# Patient Record
Sex: Male | Born: 1995 | Race: Black or African American | Hispanic: No | Marital: Single | Smoking: Never smoker
Health system: Southern US, Community
[De-identification: ages and names within clinical notes are randomized; demographics above are authoritative.]

## PROBLEM LIST (undated history)

## (undated) DIAGNOSIS — F909 Attention-deficit hyperactivity disorder, unspecified type: Secondary | ICD-10-CM

---
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
## 2021-04-29 ENCOUNTER — Emergency Department (HOSPITAL_BASED_OUTPATIENT_CLINIC_OR_DEPARTMENT_OTHER)
Admission: EM | Admit: 2021-04-29 | Discharge: 2021-04-29 | Disposition: A | Discharge status: Home / Self Care | Payer: BC Managed Care – PPO | Attending: Emergency Medicine | Admitting: Emergency Medicine

## 2021-04-29 ENCOUNTER — Other Ambulatory Visit: Payer: Self-pay

## 2021-04-29 ENCOUNTER — Encounter (HOSPITAL_BASED_OUTPATIENT_CLINIC_OR_DEPARTMENT_OTHER): Payer: Self-pay | Admitting: Emergency Medicine

## 2021-04-29 ENCOUNTER — Emergency Department (HOSPITAL_BASED_OUTPATIENT_CLINIC_OR_DEPARTMENT_OTHER): Payer: BC Managed Care – PPO

## 2021-04-29 DIAGNOSIS — S8391XA Sprain of unspecified site of right knee, initial encounter: Secondary | ICD-10-CM | POA: Insufficient documentation

## 2021-04-29 DIAGNOSIS — Y9367 Activity, basketball: Secondary | ICD-10-CM | POA: Insufficient documentation

## 2021-04-29 DIAGNOSIS — Z79899 Other long term (current) drug therapy: Secondary | ICD-10-CM | POA: Diagnosis not present

## 2021-04-29 DIAGNOSIS — S80911A Unspecified superficial injury of right knee, initial encounter: Secondary | ICD-10-CM | POA: Diagnosis present

## 2021-04-29 DIAGNOSIS — W2105XA Struck by basketball, initial encounter: Secondary | ICD-10-CM | POA: Diagnosis not present

## 2021-04-29 NOTE — Discharge Instructions (Addendum)
Wear the knee immobilizer at all times when you are up moving around just to keep your knee stable.  You can take Tylenol and ibuprofen as needed for pain.  Ice and elevate

## 2021-04-29 NOTE — ED Triage Notes (Signed)
Pt reports hyperextended knee yesterday while playing basketball. Pt has tried ice, elevation and ibuprofen without relief. Pt ambulatory with a limp.

## 2021-04-29 NOTE — ED Provider Notes (Signed)
[facility] DEPARTMENT Provider Note   CSN: [id number] Arrival date & time: 04/29/21  [id number]     History  Chief Complaint  Patient presents with   Knee Injury    Luis Andrade is a 26 y.o. male.  Patient is a healthy 26 year old male with no known medical problems who is presenting today with complaint of right knee pain.  On Thursday this week he was playing basketball when he came down and his knee hyperextended and he felt a pop.  It was painful immediately and has continued to be painful but also has been swollen and feels loose when he walks.  He denies any prior significant injury to the leg that did not improve just with time.  He has never had to see a specialist before.  However because the swelling was not improving and the pain was not getting any better he came for evaluation.  The history is provided by the patient.      Home Medications Prior to Admission medications   Medication Sig Start Date End Date Taking? Authorizing Provider  ibuprofen (ADVIL,MOTRIN) 800 MG tablet Take 1 tablet (800 mg total) by mouth 3 (three) times daily. 04/11/14   Evelina Bucy, MD  methylphenidate [facility]) 20 MG/9HR Place 1 patch onto the skin daily. wear patch for 9 hours only each day    [provider]  cetirizine (ZYRTEC) 1 MG/ML syrup Take 5 mLs (5 mg total) by mouth daily. 07/21/14 07/21/14  Glendell Docker, NP      Allergies    Patient has no known allergies.    Review of Systems   Review of Systems  Physical Exam Updated Vital Signs BP (!) 143/81 (BP Location: Right Arm)    Pulse 70    Temp 98.7 F (37.1 C) (Oral)    Resp 18    Ht 5\' 11"  ([id number] m)    Wt 74.8 kg    SpO2 99%    BMI 23.01 kg/m  Physical Exam Vitals and nursing note reviewed.  Constitutional:      General: He is not in acute distress.    Appearance: He is well-developed.  HENT:     Head: Normocephalic and atraumatic.  Eyes:     Conjunctiva/sclera: Conjunctivae normal.      Pupils: Pupils are equal, round, and reactive to light.  Cardiovascular:     Rate and Rhythm: Normal rate.     Heart sounds: No murmur heard. Pulmonary:     Effort: Pulmonary effort is normal.  Musculoskeletal:        General: Tenderness and signs of injury present.     Cervical back: Normal range of motion and neck supple.     Right knee: Effusion present. Decreased range of motion. MCL laxity present.     Comments: Patient has significant joint effusion of the right knee.  Some mild laxity and pain with MCL testing.  No definitive ACL or PCL laxity  Skin:    General: Skin is warm and dry.     Findings: No erythema or rash.  Neurological:     Mental Status: He is alert and oriented to person, place, and time. Mental status is at baseline.  Psychiatric:        Mood and Affect: Mood normal.        Behavior: Behavior normal.    ED Results / Procedures / Treatments   Labs (all labs ordered are listed, but only abnormal results are displayed) Labs Reviewed -  No data to display  EKG None  Radiology DG Knee Complete 4 Views Right  Result Date: 04/29/2021 CLINICAL DATA:  Pain and swelling EXAM: RIGHT KNEE - COMPLETE 4+ VIEW COMPARISON:  07/21/2014 FINDINGS: No fracture or dislocation is seen. There is soft tissue fullness in the suprapatellar bursa. Anterior portion of patella is not included in the lateral view limiting evaluation. IMPRESSION: No recent fracture or dislocation. Small effusion is seen in the suprapatellar bursa. Electronically Signed   By: Elmer Picker M.D.   On: 04/29/2021 09:43    Procedures Procedures    Medications Ordered in ED Medications - No data to display  ED Course/ Medical Decision Making/ A&P                           Medical Decision Making Amount and/or Complexity of Data Reviewed Radiology: ordered and independent interpretation performed. Decision-making details documented in ED Course.   Patient presenting today after an injury on  Thursday with persistent knee pain and swelling.  Based on the patient's exam and history concern for ligamentous tear especially given the extent of joint effusion and mild laxity with testing as well as patient reporting it feels loose when he tries to walk.  No other injury noted at this time.  I independently viewed and interpreted his knee film and there is no evidence of fracture at this time.  Radiology reports a small joint effusion.  Patient placed in a knee immobilizer.  Given follow-up with the orthopedist.  Findings discussed with the patient and he was discharged home in good condition.        Final Clinical Impression(s) / ED Diagnoses Final diagnoses:  Sprain of right knee, unspecified ligament, initial encounter    Rx / DC Orders ED Discharge Orders     None         Blanchie Dessert, MD 04/29/21 [phone]

## 2021-06-14 ENCOUNTER — Other Ambulatory Visit: Payer: Self-pay | Admitting: Orthopedic Surgery

## 2021-06-22 ENCOUNTER — Ambulatory Visit (HOSPITAL_COMMUNITY)
Admission: RE | Admit: 2021-06-22 | Payer: BC Managed Care – PPO | Source: Home / Self Care | Admitting: Orthopedic Surgery

## 2021-06-22 ENCOUNTER — Encounter (HOSPITAL_COMMUNITY): Admission: RE | Payer: Self-pay | Source: Home / Self Care

## 2021-06-22 SURGERY — RECONSTRUCTION, KNEE, ACL
Anesthesia: Choice | Site: Knee | Laterality: Right

## 2021-08-16 ENCOUNTER — Other Ambulatory Visit: Payer: Self-pay | Admitting: Orthopedic Surgery

## 2021-09-27 ENCOUNTER — Ambulatory Visit: Admit: 2021-09-27 | Payer: BC Managed Care – PPO | Admitting: Orthopedic Surgery

## 2021-09-27 SURGERY — RECONSTRUCTION, KNEE, ACL
Anesthesia: Choice | Site: Knee | Laterality: Right

## 2022-09-09 IMAGING — DX DG KNEE COMPLETE 4+V*R*
4 series · 4 of 4 positions shown · non-contrast
Comparison: 07/21/2014

CLINICAL DATA: Pain and swelling

EXAM:
RIGHT KNEE - COMPLETE 4+ VIEW

[knee ap]
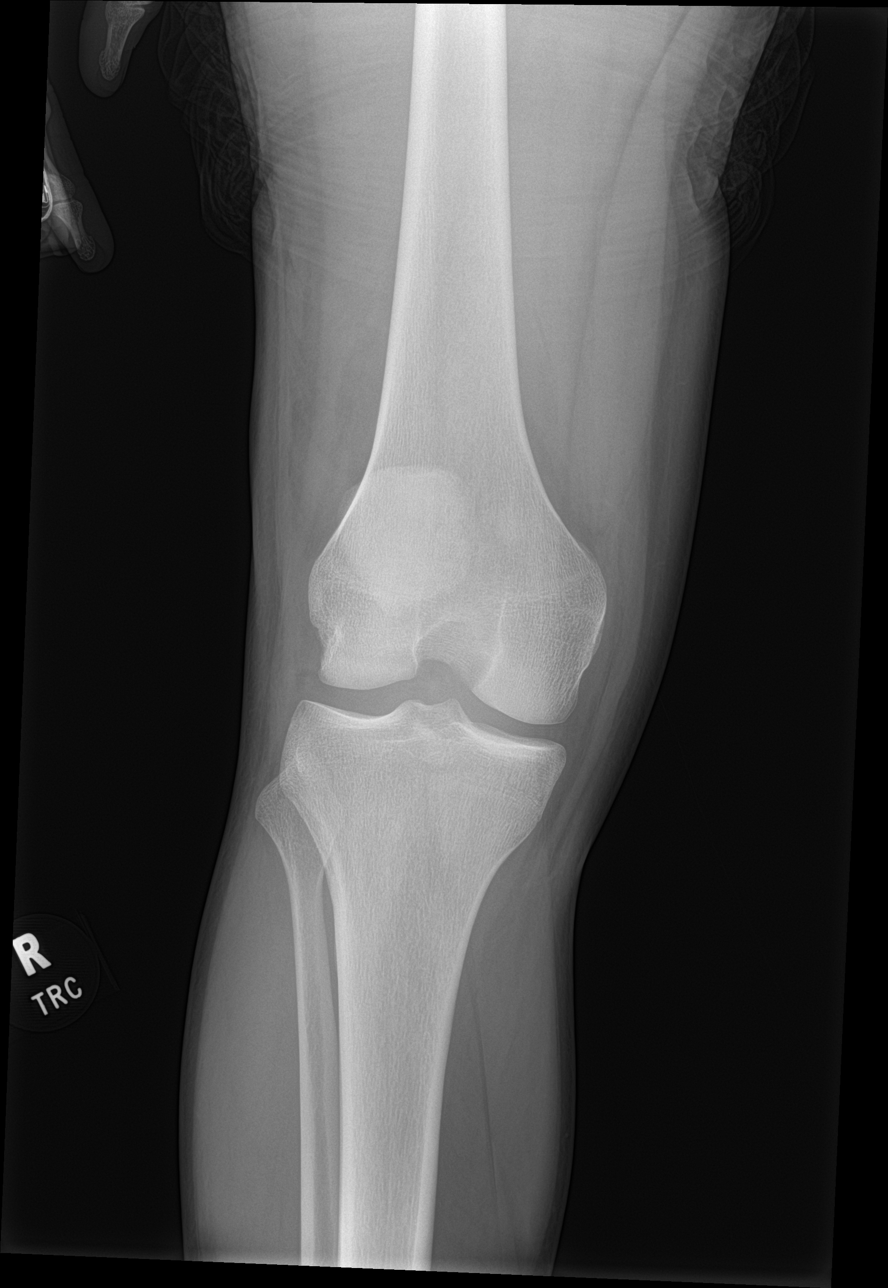

[knee obl (1 of 2)]
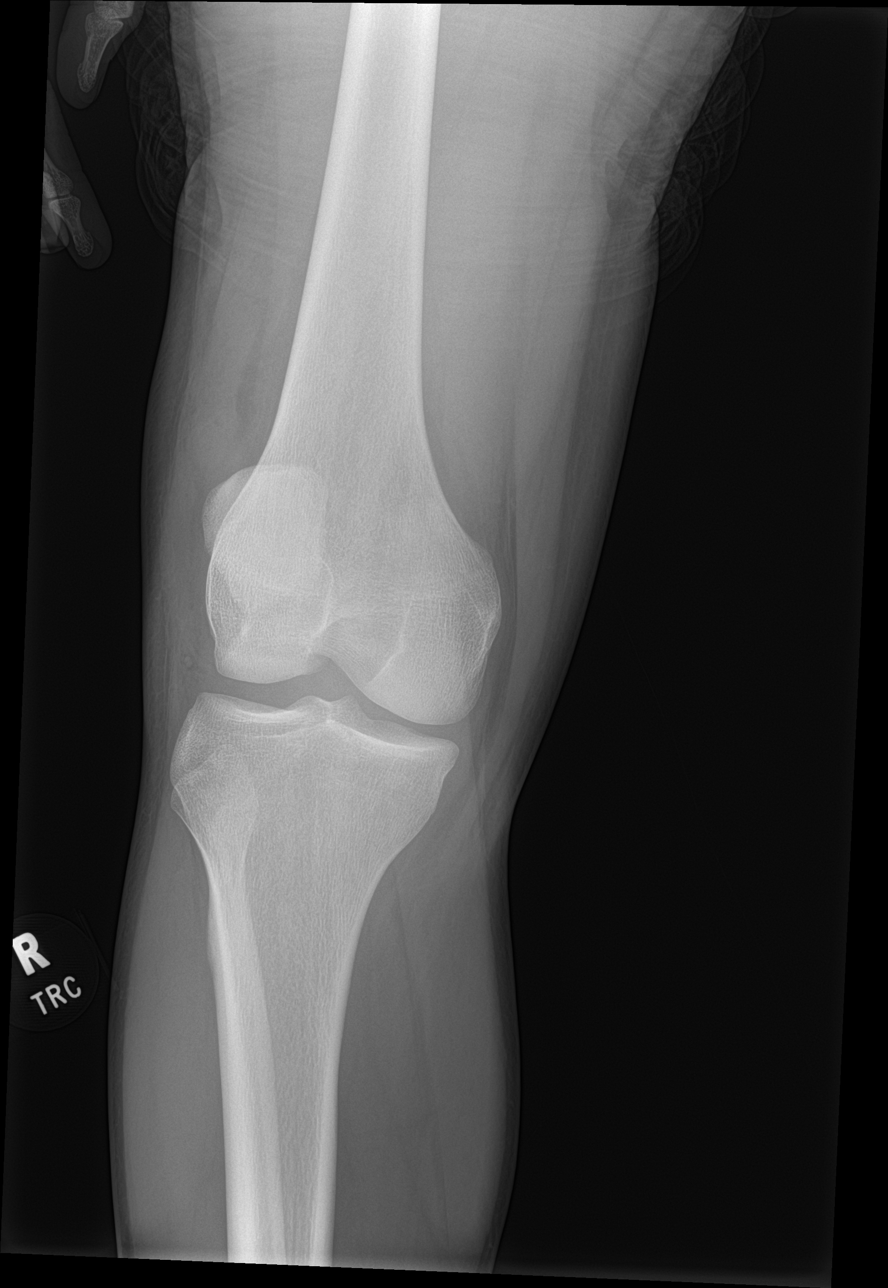

[knee obl (2 of 2)]
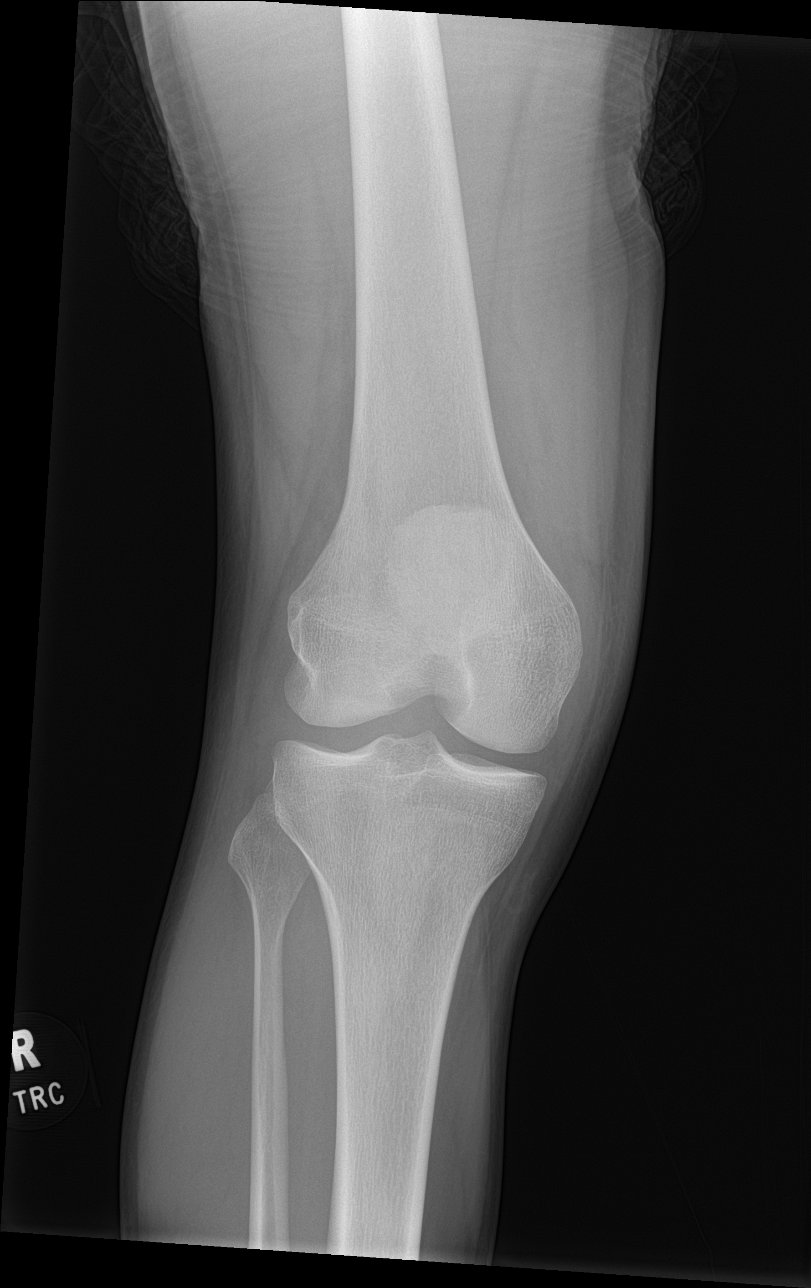

[knee lat]
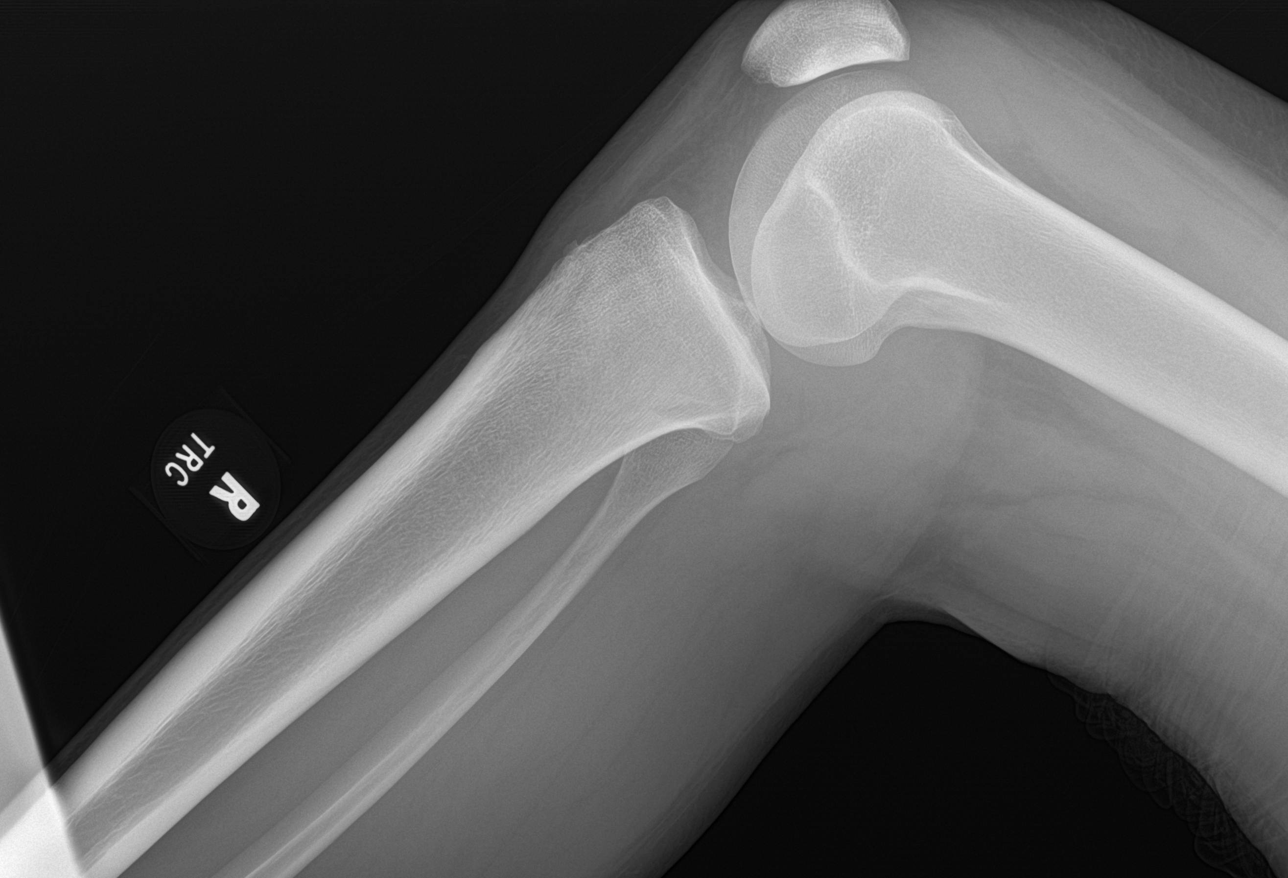

[4 of 4 positions shown; findings below may reference images not displayed]

FINDINGS: No fracture or dislocation is seen. There is soft tissue fullness in
the suprapatellar bursa. Anterior portion of patella is not included
in the lateral view limiting evaluation.
IMPRESSION: No recent fracture or dislocation. Small effusion is seen in the
suprapatellar bursa.

## 2024-04-06 ENCOUNTER — Telehealth: Admitting: Family Medicine

## 2024-04-06 DIAGNOSIS — R6889 Other general symptoms and signs: Secondary | ICD-10-CM | POA: Diagnosis not present

## 2024-04-06 DIAGNOSIS — B349 Viral infection, unspecified: Secondary | ICD-10-CM

## 2024-04-06 NOTE — Patient Instructions (Addendum)
" °  Luis Andrade, thank you for joining Luis CHRISTELLA Barefoot, NP for today's virtual visit.  While this provider is not your primary care provider (PCP), if your PCP is located in our provider database this encounter information will be shared with them immediately following your visit.   A [place] [facility] MyChart account gives you access to today's visit and all your visits, tests, and labs performed at [facility]  click here if you don't have a [facility] MyChart account or go to [email or web address]  Consent: (Patient) Luis Andrade provided verbal consent for this virtual visit at the beginning of the encounter.  Current Medications:  Current Outpatient Medications:    ibuprofen  (ADVIL ,MOTRIN ) 800 MG tablet, Take 1 tablet (800 mg total) by mouth 3 (three) times daily., Disp: 21 tablet, Rfl: 0   methylphenidate (DAYTRANA) 20 MG/9HR, Place 1 patch onto the skin daily. wear patch for 9 hours only each day, Disp: , Rfl:    Medications ordered in this encounter:  No orders of the defined types were placed in this encounter.    *If you need refills on other medications prior to your next appointment, please contact your pharmacy*  Follow-Up: Call back or seek an in-person evaluation if the symptoms worsen or if the condition fails to improve as anticipated.     Other Instructions  - Given the exposure to the flu at work, younger age appears to have a robust immune system with elevated fevers as other symptoms are starting to improve.  Advised over the next 24 to 48 hours fever should break and he should not have to have any OTC medication for this if that does not occur he is advised to go be seen in person. - Continue OTC symptomatic management of choice - Take prescribed medications as directed - Push fluids - Rest as needed - Discussed return precautions and when to seek in-person evaluation, sent via AVS as well   If you have been instructed to have an in-person  evaluation today at a local Urgent Care facility, please use the link below. It will take you to a list of all of our available [facility], including address, phone number and hours of operation. Please do not delay care.  [facility]  If you or a family member do not have a primary care provider, use the link below to schedule a visit and establish care. When you choose a [facility] primary care physician or advanced practice provider, you gain a long-term partner in health. Find a Primary Care Provider  Learn more about [facility]'s in-office and virtual care options: [facility] - Get Care Now  "
